# Patient Record
Sex: Male | Born: 1955 | Race: Black or African American | Hispanic: No | Marital: Single | Smoking: Current every day smoker
Health system: Southern US, Community
[De-identification: ages and names within clinical notes are randomized; demographics above are authoritative.]

## PROBLEM LIST (undated history)

## (undated) DIAGNOSIS — E119 Type 2 diabetes mellitus without complications: Secondary | ICD-10-CM

## (undated) DIAGNOSIS — I10 Essential (primary) hypertension: Secondary | ICD-10-CM

## (undated) HISTORY — PX: BACK SURGERY: SHX140

---

## 2018-11-11 ENCOUNTER — Encounter (HOSPITAL_COMMUNITY): Payer: Self-pay

## 2018-11-11 ENCOUNTER — Emergency Department (HOSPITAL_COMMUNITY): Payer: Self-pay

## 2018-11-11 ENCOUNTER — Other Ambulatory Visit: Payer: Self-pay

## 2018-11-11 ENCOUNTER — Emergency Department (HOSPITAL_COMMUNITY)
Admission: EM | Admit: 2018-11-11 | Discharge: 2018-11-11 | Disposition: A | Discharge status: Home / Self Care | Payer: Self-pay | Attending: Emergency Medicine | Admitting: Emergency Medicine

## 2018-11-11 DIAGNOSIS — I10 Essential (primary) hypertension: Secondary | ICD-10-CM | POA: Insufficient documentation

## 2018-11-11 DIAGNOSIS — Y939 Activity, unspecified: Secondary | ICD-10-CM | POA: Insufficient documentation

## 2018-11-11 DIAGNOSIS — M7022 Olecranon bursitis, left elbow: Secondary | ICD-10-CM | POA: Insufficient documentation

## 2018-11-11 DIAGNOSIS — F17210 Nicotine dependence, cigarettes, uncomplicated: Secondary | ICD-10-CM | POA: Insufficient documentation

## 2018-11-11 DIAGNOSIS — E119 Type 2 diabetes mellitus without complications: Secondary | ICD-10-CM | POA: Insufficient documentation

## 2018-11-11 HISTORY — DX: Essential (primary) hypertension: I10

## 2018-11-11 HISTORY — DX: Type 2 diabetes mellitus without complications: E11.9

## 2018-11-11 MED ORDER — CEPHALEXIN 500 MG PO CAPS: 500.0000 mg | ORAL_CAPSULE | Freq: Two times a day (BID) | ORAL | 0 refills | Status: AC

## 2018-11-11 MED ORDER — IBUPROFEN 800 MG PO TABS: 800.0000 mg | ORAL_TABLET | Freq: Three times a day (TID) | ORAL | 0 refills | Status: AC | PRN

## 2018-11-11 NOTE — ED Triage Notes (Signed)
Patient states he fell a month ago and 4 days ago noted that he had fluid on his left elbow. Patient denies any pain.

## 2018-11-11 NOTE — ED Provider Notes (Signed)
[facility]-EMERGENCY DEPT Provider Note   CSN: [id number] Arrival date & time: 11/11/18  1841     History   Chief Complaint Chief Complaint  Patient presents with  . Joint Swelling    HPI Jeffery Hamilton is a 63 y.o. male.  The history is provided by the patient.  Arm Injury  Location:  Arm Injury: yes   Mechanism of injury: fall   Pain details:    Quality:  Aching and dull   Severity:  Mild   Onset quality:  Gradual   Timing:  Constant   Progression:  Worsening Relieved by:  Nothing Worsened by:  Nothing Associated symptoms: no back pain, no decreased range of motion, no fatigue, no fever, no muscle weakness, no neck pain, no numbness, no stiffness, no swelling and no tingling     Past Medical History:  Diagnosis Date  . Diabetes mellitus without complication (HCC)   . Hypertension     There are no active problems to display for this patient.   Past Surgical History:  Procedure Laterality Date  . BACK SURGERY          Home Medications    Prior to Admission medications   Medication Sig Start Date End Date Taking? Authorizing Provider  cephALEXin (KEFLEX) 500 MG capsule Take 1 capsule (500 mg total) by mouth 2 (two) times daily for 10 days. 11/11/18 11/21/18  Tyrae Alcoser, DO  ibuprofen (ADVIL,MOTRIN) 800 MG tablet Take 1 tablet (800 mg total) by mouth every 8 (eight) hours as needed for up to 30 doses. 11/11/18   Virgina Norfolk, DO    Family History Family History  Problem Relation Age of Onset  . Hypertension Mother     Social History Social History   Tobacco Use  . Smoking status: Current Every Day Smoker    Packs/day: 0.50    Types: Cigarettes  . Smokeless tobacco: Never Used  Substance Use Topics  . Alcohol use: Never    Frequency: Never  . Drug use: Never     Allergies   Patient has no known allergies.   Review of Systems Review of Systems  Constitutional: Negative for fatigue and fever.  Musculoskeletal: Positive  for arthralgias and joint swelling. Negative for back pain, gait problem, myalgias, neck pain, neck stiffness and stiffness.  Skin: Negative for color change, pallor, rash and wound.     Physical Exam Updated Vital Signs BP (!) 144/107 (BP Location: Right Arm) Comment: patient states he did not take his BP med today  Pulse 83   Temp 98.2 F (36.8 C) (Oral)   Resp 18   Ht 5\' 11"  (1.803 m)   Wt 102.1 kg   SpO2 99%   BMI 31.38 kg/m   Physical Exam Cardiovascular:     Pulses: Normal pulses.     Comments: Left radial pulse 2+ Musculoskeletal: Normal range of motion.        General: Swelling (to left elbow bursa) and tenderness (left elbow) present. No deformity or signs of injury.     Comments: Normal range of motion of left upper extremity without any pain  Skin:    General: Skin is warm.     Comments: Swollen left elbow bursa with no obvious overlying cellulitis  Neurological:     Sensory: No sensory deficit.     Motor: No weakness.     Comments: 5+ out of 5 strength in left upper extremity, normal sensation throughout      ED Treatments /  Results  Labs (all labs ordered are listed, but only abnormal results are displayed) Labs Reviewed - No data to display  EKG None  Radiology Dg Elbow Complete Left  Result Date: 11/11/2018 CLINICAL DATA:  63 year old male status post fall 1 month ago. Left elbow swelling/fluid beginning about 4 days ago. EXAM: LEFT ELBOW - COMPLETE 3+ VIEW COMPARISON:  None. FINDINGS: Bone mineralization is within normal limits. Joint spaces and alignment at the left elbow are preserved. There is no evidence of elbow joint effusion, but there is a prominent degenerative appearing osteophyte off of the dorsal olecranon with surrounding soft tissue swelling. No soft tissue gas. No acute osseous abnormality identified. IMPRESSION: 1. No acute osseous abnormality identified about the left elbow. 2. Dorsal soft tissue swelling might be bursitis or cellulitis  surrounding a prominent degenerative osteophyte. Electronically Signed   By: Odessa FlemingH  Hall M.D.   On: 11/11/2018 19:34    Procedures Procedures (including critical care time)  Medications Ordered in ED Medications - No data to display   Initial Impression / Assessment and Plan / ED Course  I have reviewed the triage vital signs and the nursing notes.  Pertinent labs & imaging results that were available during my care of the patient were reviewed by me and considered in my medical decision making (see chart for details).     Jeffery Hamilton is a 63 year old male who presents to the ED with left elbow swelling.  Patient normal vitals.  No fever.  Patient states that he struck his left elbow several months ago and over the last several days he has noticed some increased swelling.  No pain with range of motion of the left elbow.  X-Tylar was done prior to my evaluation that shows likely swollen olecranon bursitis.  Patient does have osteophyte in this area that is likely the cause of the swelling.  No obvious overlying cellulitis.  No concern for septic joint as patient has full range of motion without any pain.  Will prescribe Motrin.  Recommend ice.  Patient given prescription for antibiotic in case symptoms do not improve with ice and anti-inflammatories.  Recommend follow-up with primary care doctor and given return precautions.  This chart was dictated using voice recognition software.  Despite best efforts to proofread,  errors can occur which can change the documentation meaning.    Final Clinical Impressions(s) / ED Diagnoses   Final diagnoses:  Olecranon bursitis of left elbow    ED Discharge Orders         Ordered    ibuprofen (ADVIL,MOTRIN) 800 MG tablet  Every 8 hours PRN     11/11/18 2041    cephALEXin (KEFLEX) 500 MG capsule  2 times daily     11/11/18 2041           Virgina NorfolkCuratolo, Estelita Iten, DO 11/11/18 2042

## 2018-11-11 NOTE — Discharge Instructions (Addendum)
Use Motrin and ice.  Start antibiotic if symptoms do not improve after several days.  Follow-up your primary care doctor.

## 2020-09-08 IMAGING — CR DG ELBOW COMPLETE 3+V*L*
4 series · 4 of 4 positions shown · non-contrast
Comparison: None.

CLINICAL DATA: 62-year-old male status post fall 1 month ago. Left
elbow swelling/fluid beginning about 4 days ago.

EXAM:
LEFT ELBOW - COMPLETE 3+ VIEW

[x elbow obl left (1 of 2)]
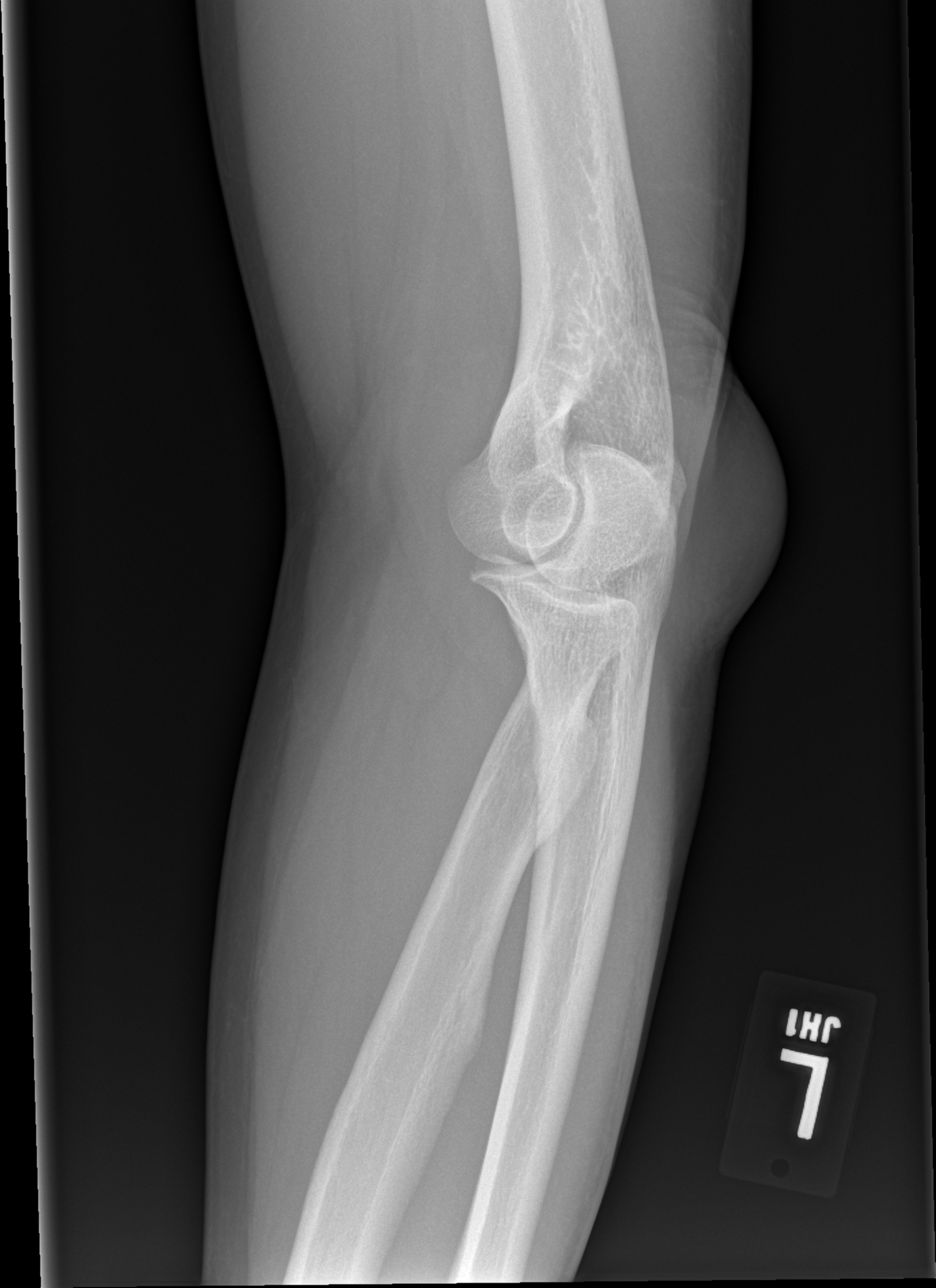

[x elbow lat left (1 of 2)]
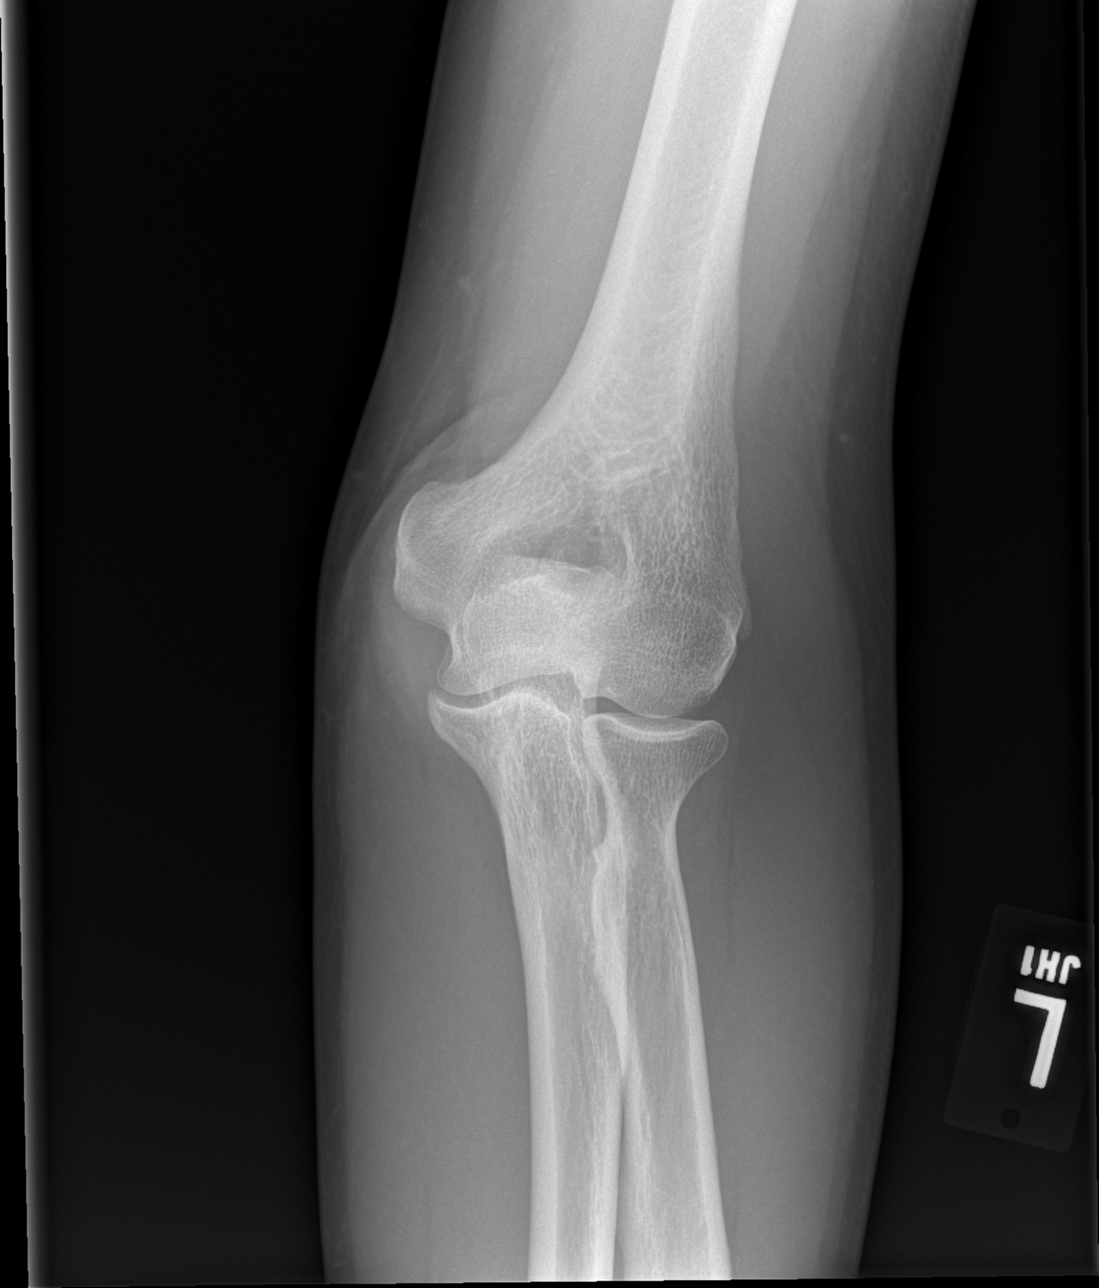

[x elbow obl left (2 of 2)]
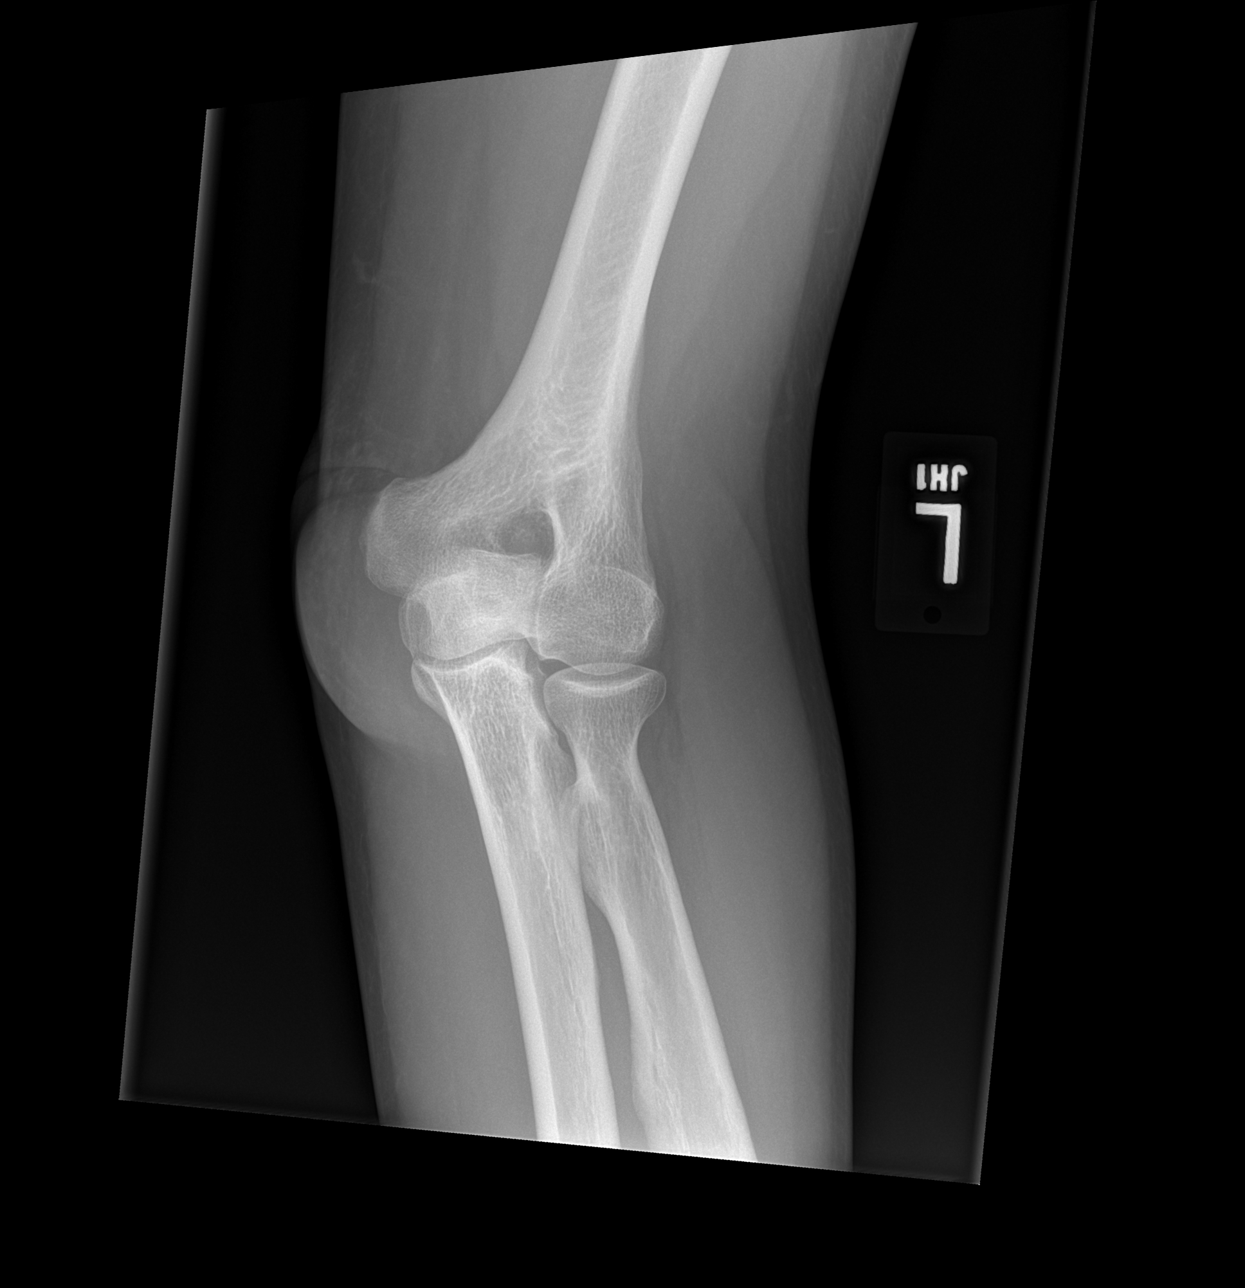

[x elbow lat left (2 of 2)]
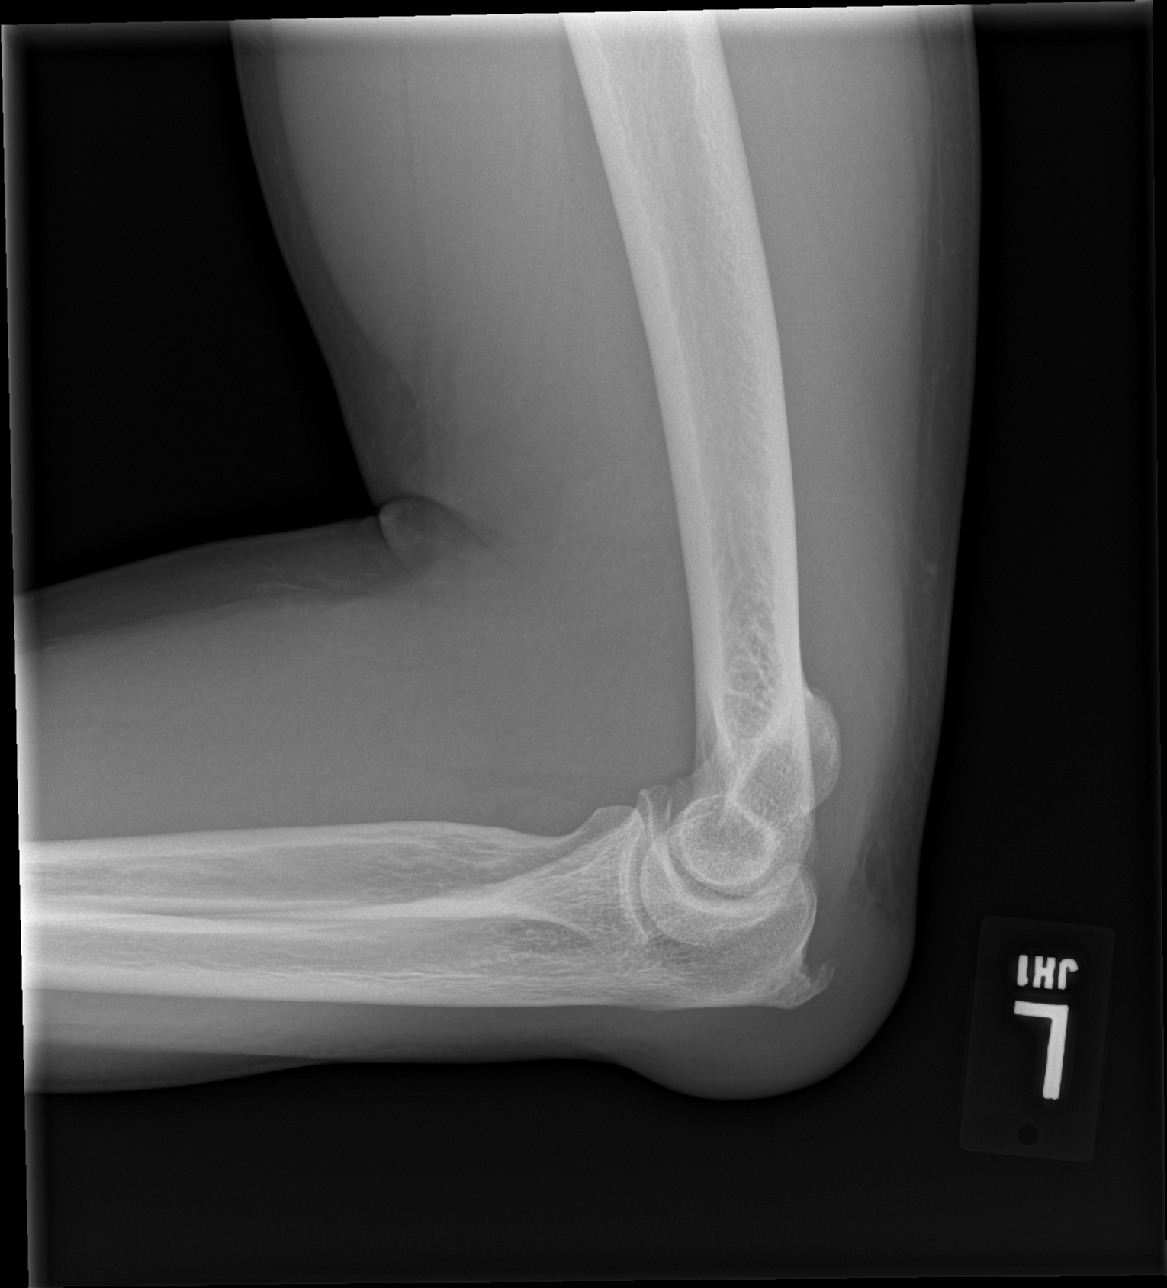

[4 of 4 positions shown; findings below may reference images not displayed]

FINDINGS: Bone mineralization is within normal limits. Joint spaces and
alignment at the left elbow are preserved. There is no evidence of
elbow joint effusion, but there is a prominent degenerative
appearing osteophyte off of the dorsal olecranon with surrounding
soft tissue swelling. No soft tissue gas. No acute osseous
abnormality identified.
IMPRESSION: 1. No acute osseous abnormality identified about the left elbow.
2. Dorsal soft tissue swelling might be bursitis or cellulitis
surrounding a prominent degenerative osteophyte.
